# Patient Record
Sex: Male | Born: 1985 | Race: Black or African American | Hispanic: No | Marital: Single | Smoking: Current every day smoker
Health system: Southern US, Community
[De-identification: ages and names within clinical notes are randomized; demographics above are authoritative.]

---

## 2009-01-02 ENCOUNTER — Emergency Department (HOSPITAL_COMMUNITY): Admission: EM | Admit: 2009-01-02 | Discharge: 2009-01-02 | Payer: Self-pay | Admitting: Emergency Medicine

## 2011-01-08 ENCOUNTER — Emergency Department (HOSPITAL_COMMUNITY): Payer: Self-pay

## 2011-01-08 ENCOUNTER — Emergency Department (HOSPITAL_COMMUNITY)
Admission: EM | Admit: 2011-01-08 | Discharge: 2011-01-08 | Disposition: A | Discharge status: Home / Self Care | Payer: No Typology Code available for payment source | Attending: Emergency Medicine | Admitting: Emergency Medicine

## 2011-01-08 DIAGNOSIS — Y92410 Unspecified street and highway as the place of occurrence of the external cause: Secondary | ICD-10-CM | POA: Insufficient documentation

## 2011-01-08 DIAGNOSIS — M549 Dorsalgia, unspecified: Secondary | ICD-10-CM | POA: Insufficient documentation

## 2011-01-08 DIAGNOSIS — S335XXA Sprain of ligaments of lumbar spine, initial encounter: Secondary | ICD-10-CM | POA: Insufficient documentation

## 2013-02-09 ENCOUNTER — Encounter (HOSPITAL_COMMUNITY): Payer: Self-pay | Admitting: Emergency Medicine

## 2013-02-09 ENCOUNTER — Emergency Department (HOSPITAL_COMMUNITY)
Admission: EM | Admit: 2013-02-09 | Discharge: 2013-02-09 | Disposition: A | Discharge status: Home / Self Care | Payer: No Typology Code available for payment source | Attending: Emergency Medicine | Admitting: Emergency Medicine

## 2013-02-09 DIAGNOSIS — H109 Unspecified conjunctivitis: Secondary | ICD-10-CM | POA: Insufficient documentation

## 2013-02-09 DIAGNOSIS — F17200 Nicotine dependence, unspecified, uncomplicated: Secondary | ICD-10-CM | POA: Insufficient documentation

## 2013-02-09 MED ORDER — FLUORESCEIN SODIUM 1 MG OP STRP
1.0000 | ORAL_STRIP | Freq: Once | OPHTHALMIC | Status: AC
  Administered 2013-02-09: 1 via OPHTHALMIC
  Filled 2013-02-09: qty 1

## 2013-02-09 MED ORDER — POLYMYXIN B-TRIMETHOPRIM 10000-0.1 UNIT/ML-% OP SOLN: 1.0000 [drp] | OPHTHALMIC | Status: DC

## 2013-02-09 MED ORDER — TETRACAINE HCL 0.5 % OP SOLN
1.0000 [drp] | Freq: Once | OPHTHALMIC | Status: AC
  Administered 2013-02-09: 1 [drp] via OPHTHALMIC
  Filled 2013-02-09: qty 2

## 2013-02-09 NOTE — ED Notes (Signed)
Pt alert, nad pt c/o drainage to left eye, onset was a few days ago, tried home remedies w/o relief, resp even unlabored, skin pwd

## 2013-02-09 NOTE — Progress Notes (Signed)
[facility] provided pt with a list of primary care resources and information on [facility].  °

## 2013-02-09 NOTE — ED Provider Notes (Signed)
Medical screening examination/treatment/procedure(s) were performed by non-physician practitioner and as supervising physician I was immediately available for consultation/collaboration.  Terez Montee L Ruhani Umland, MD 02/09/13 1450 

## 2013-02-09 NOTE — ED Provider Notes (Signed)
CSN: [id number]     Arrival date & time 02/09/13  1036 History   First MD Initiated Contact with Patient 02/09/13 1052     Chief Complaint  Patient presents with  . Conjunctivitis   (Consider location/radiation/quality/duration/timing/severity/associated sxs/prior Treatment) HPI Comments: Patient is a 27 year old male who presents with a 4 day history of right eye irritation. Symptoms started gradually and progressively worsened since the onset. Patient reports associated redness and watery discharge from right eye. No known sick contacts. Patient denies injury or foreign body. No aggravating/alleviating factors. No other associated symptoms.    History reviewed. No pertinent past medical history. History reviewed. No pertinent past surgical history. No family history on file. History  Substance Use Topics  . Smoking status: Current Every Day Smoker    Types: Cigarettes  . Smokeless tobacco: Not on file  . Alcohol Use: No    Review of Systems  Constitutional: Negative for fever, chills and fatigue.  HENT: Negative for trouble swallowing.   Eyes: Positive for discharge and redness. Negative for visual disturbance.  Respiratory: Negative for shortness of breath.   Cardiovascular: Negative for chest pain and palpitations.  Gastrointestinal: Negative for nausea, vomiting, abdominal pain and diarrhea.  Genitourinary: Negative for dysuria and difficulty urinating.  Musculoskeletal: Negative for arthralgias and neck pain.  Skin: Negative for color change.  Neurological: Negative for dizziness and weakness.  Psychiatric/Behavioral: Negative for dysphoric mood.    Allergies  Review of patient's allergies indicates no known allergies.  Home Medications   Current Outpatient Rx  Name  Route  Sig  Dispense  Refill  . naphazoline-glycerin (CLEAR EYES) 0.012-0.2 % SOLN   Both Eyes   Place 1-2 drops into both eyes every 4 (four) hours as needed for irritation.         Marland Kitchen OVER THE  COUNTER MEDICATION      [employer] brand eye ointment          BP 130/68  Pulse 79  Temp(Src) 98 F (36.7 C) (Oral)  Resp 16  Wt 235 lb (106.595 kg)  SpO2 99% Physical Exam  Nursing note and vitals reviewed. Constitutional: He is oriented to person, place, and time. He appears well-developed and well-nourished. No distress.  HENT:  Head: Normocephalic and atraumatic.  Eyes: EOM are normal. Pupils are equal, round, and reactive to light.  Right conjunctival injection. Water discharge noted. No purulent discharge.   Neck: Normal range of motion.  Cardiovascular: Normal rate and regular rhythm.  Exam reveals no gallop and no friction rub.   No murmur heard. Pulmonary/Chest: Effort normal and breath sounds normal. He has no wheezes. He has no rales. He exhibits no tenderness.  Musculoskeletal: Normal range of motion.  Neurological: He is alert and oriented to person, place, and time. Coordination normal.  Speech is goal-oriented. Moves limbs without ataxia.   Skin: Skin is warm and dry.  Psychiatric: He has a normal mood and affect. His behavior is normal.    ED Course  Procedures (including critical care time) Labs Review Labs Reviewed - No data to display Imaging Review No results found.  EKG Interpretation   None       MDM   1. Conjunctivitis     11:34 AM Patient will have a fluorescin dye exam of right eye. Vitals stable and patient afebrile.   12:06 PM Fluorescin dye exam shows no abrasion or foreign body. Patient will be discharged with polytrim eyedrops. Vitals stable and patient afebrile. Patient will be  discharged without further evaluation.   Emilia Beck, PA-C 02/09/13 1209

## 2014-04-02 ENCOUNTER — Emergency Department (INDEPENDENT_AMBULATORY_CARE_PROVIDER_SITE_OTHER)
Admission: EM | Admit: 2014-04-02 | Discharge: 2014-04-02 | Disposition: A | Discharge status: Home / Self Care | Payer: Self-pay | Source: Home / Self Care | Attending: Family Medicine | Admitting: Family Medicine

## 2014-04-02 ENCOUNTER — Encounter (HOSPITAL_COMMUNITY): Payer: Self-pay | Admitting: Emergency Medicine

## 2014-04-02 ENCOUNTER — Emergency Department (HOSPITAL_COMMUNITY)
Admission: EM | Admit: 2014-04-02 | Discharge: 2014-04-02 | Discharge status: Against Medical Advice | Payer: No Typology Code available for payment source

## 2014-04-02 DIAGNOSIS — S0081XA Abrasion of other part of head, initial encounter: Secondary | ICD-10-CM

## 2014-04-02 NOTE — ED Notes (Signed)
Pt left according to staff in registration.

## 2014-04-02 NOTE — ED Notes (Signed)
Reports slipping on ice and hitting right eye on car door.  Mild swelling and redness.    Abrasion at right brow.  Denies any visual changes.

## 2014-04-02 NOTE — Discharge Instructions (Signed)
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
## 2019-07-20 ENCOUNTER — Inpatient Hospital Stay (HOSPITAL_COMMUNITY): Payer: Self-pay

## 2019-07-20 ENCOUNTER — Emergency Department (HOSPITAL_COMMUNITY): Payer: Self-pay

## 2019-07-20 ENCOUNTER — Inpatient Hospital Stay (HOSPITAL_COMMUNITY): Payer: Self-pay | Admitting: Certified Registered"

## 2019-07-20 ENCOUNTER — Encounter (HOSPITAL_COMMUNITY): Payer: Self-pay | Admitting: Emergency Medicine

## 2019-07-20 ENCOUNTER — Encounter (HOSPITAL_COMMUNITY)
Admission: EM | Disposition: A | Discharge status: Home / Self Care | Payer: Self-pay | Source: Home / Self Care | Attending: Orthopedic Surgery

## 2019-07-20 ENCOUNTER — Inpatient Hospital Stay (HOSPITAL_COMMUNITY)
Admission: EM | Admit: 2019-07-20 | Discharge: 2019-07-21 | DRG: 494 | Disposition: A | Discharge status: Home / Self Care | Payer: Self-pay | Attending: Orthopedic Surgery | Admitting: Orthopedic Surgery

## 2019-07-20 DIAGNOSIS — S82251B Displaced comminuted fracture of shaft of right tibia, initial encounter for open fracture type I or II: Principal | ICD-10-CM | POA: Diagnosis present

## 2019-07-20 DIAGNOSIS — S82201A Unspecified fracture of shaft of right tibia, initial encounter for closed fracture: Secondary | ICD-10-CM | POA: Diagnosis present

## 2019-07-20 DIAGNOSIS — M62838 Other muscle spasm: Secondary | ICD-10-CM | POA: Diagnosis present

## 2019-07-20 DIAGNOSIS — S82202A Unspecified fracture of shaft of left tibia, initial encounter for closed fracture: Secondary | ICD-10-CM

## 2019-07-20 DIAGNOSIS — Z20822 Contact with and (suspected) exposure to covid-19: Secondary | ICD-10-CM | POA: Diagnosis present

## 2019-07-20 DIAGNOSIS — F17210 Nicotine dependence, cigarettes, uncomplicated: Secondary | ICD-10-CM | POA: Diagnosis present

## 2019-07-20 DIAGNOSIS — Z419 Encounter for procedure for purposes other than remedying health state, unspecified: Secondary | ICD-10-CM

## 2019-07-20 DIAGNOSIS — W3400XA Accidental discharge from unspecified firearms or gun, initial encounter: Secondary | ICD-10-CM

## 2019-07-20 DIAGNOSIS — S82101A Unspecified fracture of upper end of right tibia, initial encounter for closed fracture: Secondary | ICD-10-CM | POA: Diagnosis present

## 2019-07-20 HISTORY — PX: TIBIA IM NAIL INSERTION: SHX2516

## 2019-07-20 LAB — MRSA PCR SCREENING: MRSA by PCR: NEGATIVE

## 2019-07-20 LAB — CBC WITH DIFFERENTIAL/PLATELET
Abs Immature Granulocytes: 0.12 10*3/uL — ABNORMAL HIGH (ref 0.00–0.07)
Basophils Absolute: 0.1 10*3/uL (ref 0.0–0.1)
Basophils Relative: 0 %
Eosinophils Absolute: 0 10*3/uL (ref 0.0–0.5)
Eosinophils Relative: 0 %
HCT: 40.6 % (ref 39.0–52.0)
Hemoglobin: 13.3 g/dL (ref 13.0–17.0)
Immature Granulocytes: 1 %
Lymphocytes Relative: 7 %
Lymphs Abs: 1.6 10*3/uL (ref 0.7–4.0)
MCH: 32 pg (ref 26.0–34.0)
MCHC: 32.8 g/dL (ref 30.0–36.0)
MCV: 97.6 fL (ref 80.0–100.0)
Monocytes Absolute: 1 10*3/uL (ref 0.1–1.0)
Monocytes Relative: 4 %
Neutro Abs: 20 10*3/uL — ABNORMAL HIGH (ref 1.7–7.7)
Neutrophils Relative %: 88 %
Platelets: 284 10*3/uL (ref 150–400)
RBC: 4.16 MIL/uL — ABNORMAL LOW (ref 4.22–5.81)
RDW: 13.6 % (ref 11.5–15.5)
WBC: 22.8 10*3/uL — ABNORMAL HIGH (ref 4.0–10.5)
nRBC: 0 % (ref 0.0–0.2)

## 2019-07-20 LAB — SARS CORONAVIRUS 2 BY RT PCR (HOSPITAL ORDER, PERFORMED IN ~~LOC~~ HOSPITAL LAB): SARS Coronavirus 2: NEGATIVE

## 2019-07-20 LAB — COMPREHENSIVE METABOLIC PANEL
ALT: 17 U/L (ref 0–44)
AST: 28 U/L (ref 15–41)
Albumin: 3.8 g/dL (ref 3.5–5.0)
Alkaline Phosphatase: 54 U/L (ref 38–126)
Anion gap: 9 (ref 5–15)
BUN: 13 mg/dL (ref 6–20)
CO2: 26 mmol/L (ref 22–32)
Calcium: 9 mg/dL (ref 8.9–10.3)
Chloride: 102 mmol/L (ref 98–111)
Creatinine, Ser: 1.08 mg/dL (ref 0.61–1.24)
GFR calc Af Amer: >60 mL/min (ref >60)
GFR calc non Af Amer: >60 mL/min (ref >60)
Glucose, Bld: 126 mg/dL — ABNORMAL HIGH (ref 70–99)
Potassium: 3.8 mmol/L (ref 3.5–5.1)
Sodium: 137 mmol/L (ref 135–145)
Total Bilirubin: 0.6 mg/dL (ref 0.3–1.2)
Total Protein: 6.1 g/dL — ABNORMAL LOW (ref 6.5–8.1)

## 2019-07-20 LAB — ABO/RH: ABO/RH(D): O POS

## 2019-07-20 LAB — PROTIME-INR
INR: 1 (ref 0.8–1.2)
Prothrombin Time: 12.6 seconds (ref 11.4–15.2)

## 2019-07-20 LAB — HIV ANTIBODY (ROUTINE TESTING W REFLEX): HIV Screen 4th Generation wRfx: NONREACTIVE

## 2019-07-20 LAB — TYPE AND SCREEN
ABO/RH(D): O POS
Antibody Screen: NEGATIVE

## 2019-07-20 SURGERY — INSERTION, INTRAMEDULLARY ROD, TIBIA
Anesthesia: General | Site: Leg Lower | Laterality: Right

## 2019-07-20 MED ORDER — HYDROMORPHONE HCL 1 MG/ML IJ SOLN
1.0000 mg | INTRAMUSCULAR | Status: AC | PRN
  Administered 2019-07-20 (×3): 1 mg via INTRAVENOUS
  Filled 2019-07-20 (×4): qty 1

## 2019-07-20 MED ORDER — ONDANSETRON HCL 4 MG/2ML IJ SOLN: 4.0000 mg | Freq: Four times a day (QID) | INTRAMUSCULAR | Status: DC | PRN

## 2019-07-20 MED ORDER — ACETAMINOPHEN 500 MG PO TABS
1000.0000 mg | ORAL_TABLET | Freq: Four times a day (QID) | ORAL | Status: DC
  Administered 2019-07-20 – 2019-07-21 (×3): 1000 mg via ORAL
  Filled 2019-07-20 (×3): qty 2

## 2019-07-20 MED ORDER — MIDAZOLAM HCL 2 MG/2ML IJ SOLN
INTRAMUSCULAR | Status: AC
  Filled 2019-07-20: qty 2

## 2019-07-20 MED ORDER — SENNA 8.6 MG PO TABS
1.0000 | ORAL_TABLET | Freq: Two times a day (BID) | ORAL | Status: DC
  Administered 2019-07-20: 8.6 mg via ORAL
  Filled 2019-07-20: qty 1

## 2019-07-20 MED ORDER — ONDANSETRON HCL 4 MG PO TABS: 4.0000 mg | ORAL_TABLET | Freq: Four times a day (QID) | ORAL | Status: DC | PRN

## 2019-07-20 MED ORDER — PROPOFOL 10 MG/ML IV BOLUS
INTRAVENOUS | Status: DC | PRN
  Administered 2019-07-20: 200 mg via INTRAVENOUS

## 2019-07-20 MED ORDER — DOCUSATE SODIUM 100 MG PO CAPS
100.0000 mg | ORAL_CAPSULE | Freq: Two times a day (BID) | ORAL | Status: DC
  Administered 2019-07-20: 100 mg via ORAL
  Filled 2019-07-20: qty 1

## 2019-07-20 MED ORDER — POTASSIUM CHLORIDE IN NACL 20-0.9 MEQ/L-% IV SOLN
INTRAVENOUS | Status: DC
  Filled 2019-07-20 (×2): qty 1000

## 2019-07-20 MED ORDER — LIDOCAINE 2% (20 MG/ML) 5 ML SYRINGE
INTRAMUSCULAR | Status: DC | PRN
  Administered 2019-07-20: 40 mg via INTRAVENOUS

## 2019-07-20 MED ORDER — 0.9 % SODIUM CHLORIDE (POUR BTL) OPTIME
TOPICAL | Status: DC | PRN
  Administered 2019-07-20: 1000 mL

## 2019-07-20 MED ORDER — METOCLOPRAMIDE HCL 5 MG PO TABS: 5.0000 mg | ORAL_TABLET | Freq: Three times a day (TID) | ORAL | Status: DC | PRN

## 2019-07-20 MED ORDER — OXYCODONE HCL 5 MG PO TABS
10.0000 mg | ORAL_TABLET | ORAL | Status: DC | PRN
  Administered 2019-07-21: 10 mg via ORAL

## 2019-07-20 MED ORDER — FENTANYL CITRATE (PF) 250 MCG/5ML IJ SOLN
INTRAMUSCULAR | Status: AC
  Filled 2019-07-20: qty 5

## 2019-07-20 MED ORDER — KETOROLAC TROMETHAMINE 15 MG/ML IJ SOLN
7.5000 mg | Freq: Four times a day (QID) | INTRAMUSCULAR | Status: DC
  Administered 2019-07-20 – 2019-07-21 (×3): 7.5 mg via INTRAVENOUS
  Filled 2019-07-20 (×3): qty 1

## 2019-07-20 MED ORDER — ZOLPIDEM TARTRATE 5 MG PO TABS: 5.0000 mg | ORAL_TABLET | Freq: Every evening | ORAL | Status: DC | PRN

## 2019-07-20 MED ORDER — OXYCODONE HCL 5 MG PO TABS
5.0000 mg | ORAL_TABLET | ORAL | Status: DC | PRN
  Administered 2019-07-20 – 2019-07-21 (×2): 10 mg via ORAL
  Filled 2019-07-20 (×3): qty 2

## 2019-07-20 MED ORDER — METHOCARBAMOL 500 MG PO TABS: 500.0000 mg | ORAL_TABLET | Freq: Four times a day (QID) | ORAL | Status: DC | PRN

## 2019-07-20 MED ORDER — CEFAZOLIN SODIUM-DEXTROSE 2-4 GM/100ML-% IV SOLN
2.0000 g | INTRAVENOUS | Status: AC
  Administered 2019-07-20: 2 g via INTRAVENOUS
  Filled 2019-07-20: qty 100

## 2019-07-20 MED ORDER — ACETAMINOPHEN 10 MG/ML IV SOLN
INTRAVENOUS | Status: AC
  Administered 2019-07-20: 1000 mg via INTRAVENOUS
  Filled 2019-07-20: qty 100

## 2019-07-20 MED ORDER — CEFAZOLIN SODIUM-DEXTROSE 2-4 GM/100ML-% IV SOLN
2.0000 g | Freq: Four times a day (QID) | INTRAVENOUS | Status: AC
  Administered 2019-07-20 – 2019-07-21 (×3): 2 g via INTRAVENOUS
  Filled 2019-07-20 (×3): qty 100

## 2019-07-20 MED ORDER — METHOCARBAMOL 1000 MG/10ML IJ SOLN: 500.0000 mg | Freq: Four times a day (QID) | INTRAVENOUS | Status: DC | PRN

## 2019-07-20 MED ORDER — DEXMEDETOMIDINE HCL IN NACL 200 MCG/50ML IV SOLN
INTRAVENOUS | Status: AC
  Filled 2019-07-20: qty 50

## 2019-07-20 MED ORDER — DIPHENHYDRAMINE HCL 50 MG/ML IJ SOLN
INTRAMUSCULAR | Status: AC
  Filled 2019-07-20: qty 1

## 2019-07-20 MED ORDER — CHLORHEXIDINE GLUCONATE 4 % EX LIQD
60.0000 mL | Freq: Once | CUTANEOUS | Status: DC
  Filled 2019-07-20: qty 60

## 2019-07-20 MED ORDER — CEFAZOLIN SODIUM-DEXTROSE 2-4 GM/100ML-% IV SOLN
INTRAVENOUS | Status: AC
  Filled 2019-07-20: qty 100

## 2019-07-20 MED ORDER — MIDAZOLAM HCL 5 MG/5ML IJ SOLN
INTRAMUSCULAR | Status: DC | PRN
  Administered 2019-07-20: 2 mg via INTRAVENOUS

## 2019-07-20 MED ORDER — CEFAZOLIN SODIUM-DEXTROSE 1-4 GM/50ML-% IV SOLN
1.0000 g | Freq: Once | INTRAVENOUS | Status: AC
  Administered 2019-07-20: 1 g via INTRAVENOUS
  Filled 2019-07-20: qty 50

## 2019-07-20 MED ORDER — DIPHENHYDRAMINE HCL 12.5 MG/5ML PO ELIX: 12.5000 mg | ORAL_SOLUTION | ORAL | Status: DC | PRN

## 2019-07-20 MED ORDER — SUGAMMADEX SODIUM 200 MG/2ML IV SOLN
INTRAVENOUS | Status: DC | PRN
  Administered 2019-07-20: 200 mg via INTRAVENOUS

## 2019-07-20 MED ORDER — OXYCODONE HCL 5 MG PO TABS: 5.0000 mg | ORAL_TABLET | ORAL | Status: DC | PRN

## 2019-07-20 MED ORDER — METHOCARBAMOL 1000 MG/10ML IJ SOLN
500.0000 mg | Freq: Four times a day (QID) | INTRAVENOUS | Status: DC | PRN
  Filled 2019-07-20: qty 5

## 2019-07-20 MED ORDER — BISACODYL 10 MG RE SUPP: 10.0000 mg | Freq: Every day | RECTAL | Status: DC | PRN

## 2019-07-20 MED ORDER — ALBUMIN HUMAN 5 % IV SOLN
INTRAVENOUS | Status: DC | PRN
Start: 2019-07-20 — End: 2019-07-20

## 2019-07-20 MED ORDER — SENNOSIDES-DOCUSATE SODIUM 8.6-50 MG PO TABS: 1.0000 | ORAL_TABLET | Freq: Every evening | ORAL | Status: DC | PRN

## 2019-07-20 MED ORDER — FENTANYL CITRATE (PF) 100 MCG/2ML IJ SOLN
INTRAMUSCULAR | Status: DC | PRN
  Administered 2019-07-20: 100 ug via INTRAVENOUS
  Administered 2019-07-20 (×3): 50 ug via INTRAVENOUS
  Administered 2019-07-20: 100 ug via INTRAVENOUS
  Administered 2019-07-20 (×3): 50 ug via INTRAVENOUS

## 2019-07-20 MED ORDER — HYDROMORPHONE HCL 1 MG/ML IJ SOLN: 0.2500 mg | INTRAMUSCULAR | Status: DC | PRN

## 2019-07-20 MED ORDER — MAGNESIUM CITRATE PO SOLN: 1.0000 | Freq: Once | ORAL | Status: DC | PRN

## 2019-07-20 MED ORDER — METOCLOPRAMIDE HCL 5 MG/ML IJ SOLN: 5.0000 mg | Freq: Three times a day (TID) | INTRAMUSCULAR | Status: DC | PRN

## 2019-07-20 MED ORDER — BACLOFEN 10 MG PO TABS
10.0000 mg | ORAL_TABLET | Freq: Three times a day (TID) | ORAL | 0 refills | Status: AC
Start: 2019-07-20 — End: ?

## 2019-07-20 MED ORDER — POTASSIUM CHLORIDE IN NACL 20-0.45 MEQ/L-% IV SOLN
INTRAVENOUS | Status: DC
  Filled 2019-07-20 (×2): qty 1000

## 2019-07-20 MED ORDER — LACTATED RINGERS IV SOLN: INTRAVENOUS | Status: DC

## 2019-07-20 MED ORDER — DEXMEDETOMIDINE HCL IN NACL 200 MCG/50ML IV SOLN
INTRAVENOUS | Status: DC | PRN
Start: 2019-07-20 — End: 2019-07-20
  Administered 2019-07-20: 12 ug via INTRAVENOUS
  Administered 2019-07-20 (×2): 8 ug via INTRAVENOUS

## 2019-07-20 MED ORDER — HYDROMORPHONE HCL 1 MG/ML IJ SOLN
0.5000 mg | INTRAMUSCULAR | Status: DC | PRN
  Administered 2019-07-20: 1 mg via INTRAVENOUS
  Filled 2019-07-20: qty 1

## 2019-07-20 MED ORDER — PROPOFOL 10 MG/ML IV BOLUS
INTRAVENOUS | Status: AC
  Filled 2019-07-20: qty 40

## 2019-07-20 MED ORDER — DIPHENHYDRAMINE HCL 50 MG/ML IJ SOLN
INTRAMUSCULAR | Status: DC | PRN
Start: 2019-07-20 — End: 2019-07-20
  Administered 2019-07-20: 12.5 mg via INTRAVENOUS

## 2019-07-20 MED ORDER — OXYCODONE HCL 5 MG/5ML PO SOLN: 5.0000 mg | Freq: Once | ORAL | Status: DC | PRN

## 2019-07-20 MED ORDER — FENTANYL CITRATE (PF) 100 MCG/2ML IJ SOLN
25.0000 ug | INTRAMUSCULAR | Status: DC | PRN
  Administered 2019-07-20: 50 ug via INTRAVENOUS

## 2019-07-20 MED ORDER — HYDROMORPHONE HCL 1 MG/ML IJ SOLN
INTRAMUSCULAR | Status: AC
  Filled 2019-07-20: qty 1

## 2019-07-20 MED ORDER — ARTIFICIAL TEARS OPHTHALMIC OINT
TOPICAL_OINTMENT | OPHTHALMIC | Status: AC
  Filled 2019-07-20: qty 3.5

## 2019-07-20 MED ORDER — ONDANSETRON HCL 4 MG/2ML IJ SOLN
INTRAMUSCULAR | Status: DC | PRN
  Administered 2019-07-20: 4 mg via INTRAVENOUS

## 2019-07-20 MED ORDER — OXYCODONE HCL 5 MG PO TABS: 5.0000 mg | ORAL_TABLET | ORAL | 0 refills | Status: AC | PRN

## 2019-07-20 MED ORDER — HYDROMORPHONE HCL 1 MG/ML IJ SOLN
0.5000 mg | INTRAMUSCULAR | Status: DC | PRN
  Administered 2019-07-20 – 2019-07-21 (×2): 1 mg via INTRAVENOUS
  Filled 2019-07-20 (×2): qty 1

## 2019-07-20 MED ORDER — OXYCODONE HCL 5 MG PO TABS
10.0000 mg | ORAL_TABLET | ORAL | Status: DC | PRN
  Administered 2019-07-20: 10 mg via ORAL
  Filled 2019-07-20: qty 2

## 2019-07-20 MED ORDER — SENNA-DOCUSATE SODIUM 8.6-50 MG PO TABS: 2.0000 | ORAL_TABLET | Freq: Every day | ORAL | 1 refills | Status: AC

## 2019-07-20 MED ORDER — ASPIRIN EC 325 MG PO TBEC: 325.0000 mg | DELAYED_RELEASE_TABLET | Freq: Two times a day (BID) | ORAL | 0 refills | Status: AC

## 2019-07-20 MED ORDER — OXYCODONE HCL 5 MG PO TABS: 5.0000 mg | ORAL_TABLET | Freq: Once | ORAL | Status: DC | PRN

## 2019-07-20 MED ORDER — ACETAMINOPHEN 325 MG PO TABS: 325.0000 mg | ORAL_TABLET | Freq: Four times a day (QID) | ORAL | Status: DC | PRN

## 2019-07-20 MED ORDER — FENTANYL CITRATE (PF) 100 MCG/2ML IJ SOLN
INTRAMUSCULAR | Status: AC
  Administered 2019-07-20: 50 ug via INTRAVENOUS
  Filled 2019-07-20: qty 2

## 2019-07-20 MED ORDER — ASPIRIN 325 MG PO TABS
325.0000 mg | ORAL_TABLET | Freq: Two times a day (BID) | ORAL | Status: DC
  Administered 2019-07-20 – 2019-07-21 (×2): 325 mg via ORAL
  Filled 2019-07-20 (×2): qty 1

## 2019-07-20 MED ORDER — SUCCINYLCHOLINE CHLORIDE 200 MG/10ML IV SOSY
PREFILLED_SYRINGE | INTRAVENOUS | Status: AC
  Filled 2019-07-20: qty 30

## 2019-07-20 MED ORDER — ONDANSETRON HCL 4 MG PO TABS: 4.0000 mg | ORAL_TABLET | Freq: Three times a day (TID) | ORAL | 0 refills | Status: AC | PRN

## 2019-07-20 MED ORDER — ACETAMINOPHEN 10 MG/ML IV SOLN: 1000.0000 mg | Freq: Once | INTRAVENOUS | Status: DC | PRN

## 2019-07-20 MED ORDER — LIDOCAINE 2% (20 MG/ML) 5 ML SYRINGE
INTRAMUSCULAR | Status: AC
  Filled 2019-07-20: qty 5

## 2019-07-20 MED ORDER — ACETAMINOPHEN 500 MG PO TABS
1000.0000 mg | ORAL_TABLET | Freq: Four times a day (QID) | ORAL | Status: DC
  Administered 2019-07-20: 1000 mg via ORAL
  Filled 2019-07-20 (×2): qty 2

## 2019-07-20 MED ORDER — ROCURONIUM BROMIDE 10 MG/ML (PF) SYRINGE
PREFILLED_SYRINGE | INTRAVENOUS | Status: AC
  Filled 2019-07-20: qty 30

## 2019-07-20 MED ORDER — LACTATED RINGERS IV SOLN: INTRAVENOUS | Status: DC | PRN

## 2019-07-20 MED ORDER — ROCURONIUM BROMIDE 10 MG/ML (PF) SYRINGE
PREFILLED_SYRINGE | INTRAVENOUS | Status: DC | PRN
  Administered 2019-07-20: 70 mg via INTRAVENOUS
  Administered 2019-07-20: 30 mg via INTRAVENOUS

## 2019-07-20 MED ORDER — POVIDONE-IODINE 10 % EX SWAB: 2.0000 "application " | Freq: Once | CUTANEOUS | Status: DC

## 2019-07-20 MED ORDER — PROMETHAZINE HCL 25 MG/ML IJ SOLN
6.2500 mg | INTRAMUSCULAR | Status: DC | PRN
Start: 2019-07-20 — End: 2019-07-20

## 2019-07-20 MED ORDER — POLYETHYLENE GLYCOL 3350 17 G PO PACK: 17.0000 g | PACK | Freq: Every day | ORAL | Status: DC | PRN

## 2019-07-20 MED ORDER — ONDANSETRON HCL 4 MG/2ML IJ SOLN
INTRAMUSCULAR | Status: AC
  Filled 2019-07-20: qty 2

## 2019-07-20 MED ORDER — METHOCARBAMOL 500 MG PO TABS
500.0000 mg | ORAL_TABLET | Freq: Four times a day (QID) | ORAL | Status: DC | PRN
  Administered 2019-07-20 – 2019-07-21 (×3): 500 mg via ORAL
  Filled 2019-07-20 (×3): qty 1

## 2019-07-20 MED ORDER — ENSURE PRE-SURGERY PO LIQD
296.0000 mL | Freq: Once | ORAL | Status: DC
  Filled 2019-07-20: qty 296

## 2019-07-20 MED ORDER — FLEET ENEMA 7-19 GM/118ML RE ENEM: 1.0000 | ENEMA | Freq: Once | RECTAL | Status: DC | PRN

## 2019-07-20 SURGICAL SUPPLY — 73 items
BANDAGE ESMARK 6X9 LF (GAUZE/BANDAGES/DRESSINGS) ×1 IMPLANT
BIT DRILL 2.5X2.75 QC CALB (BIT) ×3 IMPLANT
BIT DRILL 3.8X6 NS (BIT) ×3 IMPLANT
BIT DRILL 4.4 NS (BIT) ×3 IMPLANT
BLADE SURG 15 STRL LF DISP TIS (BLADE) ×1 IMPLANT
BLADE SURG 15 STRL SS (BLADE) ×2
BNDG COHESIVE 6X5 TAN STRL LF (GAUZE/BANDAGES/DRESSINGS) ×3 IMPLANT
BNDG ELASTIC 4X5.8 VLCR STR LF (GAUZE/BANDAGES/DRESSINGS) ×3 IMPLANT
BNDG ELASTIC 6X10 VLCR STRL LF (GAUZE/BANDAGES/DRESSINGS) ×6 IMPLANT
BNDG ELASTIC 6X5.8 VLCR STR LF (GAUZE/BANDAGES/DRESSINGS) ×3 IMPLANT
BNDG ESMARK 6X9 LF (GAUZE/BANDAGES/DRESSINGS) ×3
BNDG GAUZE ELAST 4 BULKY (GAUZE/BANDAGES/DRESSINGS) ×3 IMPLANT
BOOTCOVER CLEANROOM LRG (PROTECTIVE WEAR) ×6 IMPLANT
COVER SURGICAL LIGHT HANDLE (MISCELLANEOUS) ×6 IMPLANT
COVER WAND RF STERILE (DRAPES) ×3 IMPLANT
CUFF TOURN SGL QUICK 34 (TOURNIQUET CUFF)
CUFF TRNQT CYL 34X4.125X (TOURNIQUET CUFF) IMPLANT
DRAPE C-ARM 42X72 X-RAY (DRAPES) ×3 IMPLANT
DRAPE HALF SHEET 40X57 (DRAPES) ×6 IMPLANT
DRAPE IMP U-DRAPE 54X76 (DRAPES) ×3 IMPLANT
DRAPE ORTHO SPLIT 77X108 STRL (DRAPES) ×6
DRAPE SURG ORHT 6 SPLT 77X108 (DRAPES) ×3 IMPLANT
DRAPE U-SHAPE 47X51 STRL (DRAPES) ×3 IMPLANT
DRSG ADAPTIC 3X8 NADH LF (GAUZE/BANDAGES/DRESSINGS) ×3 IMPLANT
DRSG PAD ABDOMINAL 8X10 ST (GAUZE/BANDAGES/DRESSINGS) ×3 IMPLANT
DRSG XEROFORM 1X8 (GAUZE/BANDAGES/DRESSINGS) ×3 IMPLANT
DURAPREP 26ML APPLICATOR (WOUND CARE) ×3 IMPLANT
ELECT REM PT RETURN 9FT ADLT (ELECTROSURGICAL) ×3
ELECTRODE REM PT RTRN 9FT ADLT (ELECTROSURGICAL) ×1 IMPLANT
GAUZE SPONGE 4X4 12PLY STRL (GAUZE/BANDAGES/DRESSINGS) ×3 IMPLANT
GLOVE BIOGEL PI IND STRL 7.0 (GLOVE) ×1 IMPLANT
GLOVE BIOGEL PI INDICATOR 7.0 (GLOVE) ×2
GLOVE BIOGEL PI ORTHO PRO SZ8 (GLOVE) ×2
GLOVE ORTHO TXT STRL SZ7.5 (GLOVE) ×3 IMPLANT
GLOVE PI ORTHO PRO STRL SZ8 (GLOVE) ×1 IMPLANT
GLOVE SURG ORTHO 8.0 STRL STRW (GLOVE) ×6 IMPLANT
GOWN STRL REUS W/ TWL LRG LVL3 (GOWN DISPOSABLE) ×3 IMPLANT
GOWN STRL REUS W/TWL LRG LVL3 (GOWN DISPOSABLE) ×6
GUIDEPIN 3.2X17.5 THRD DISP (PIN) ×3 IMPLANT
GUIDEWIRE BALL NOSE 80CM (WIRE) ×3 IMPLANT
KIT BASIN OR (CUSTOM PROCEDURE TRAY) ×3 IMPLANT
KIT TURNOVER KIT B (KITS) ×3 IMPLANT
MANIFOLD NEPTUNE II (INSTRUMENTS) ×3 IMPLANT
NAIL TIBIAL 10X39M (Nail) ×3 IMPLANT
NS IRRIG 1000ML POUR BTL (IV SOLUTION) ×3 IMPLANT
PACK GENERAL/GYN (CUSTOM PROCEDURE TRAY) ×3 IMPLANT
PACK UNIVERSAL I (CUSTOM PROCEDURE TRAY) ×3 IMPLANT
PAD ARMBOARD 7.5X6 YLW CONV (MISCELLANEOUS) ×6 IMPLANT
PAD CAST 3X4 CTTN HI CHSV (CAST SUPPLIES) ×3 IMPLANT
PAD CAST 4YDX4 CTTN HI CHSV (CAST SUPPLIES) ×1 IMPLANT
PADDING CAST COTTON 3X4 STRL (CAST SUPPLIES) ×6
PADDING CAST COTTON 4X4 STRL (CAST SUPPLIES) ×2
PADDING CAST COTTON 6X4 STRL (CAST SUPPLIES) ×3 IMPLANT
PLATE TUB 100DEG 5 HO (Plate) ×3 IMPLANT
SCREW ACECAP 40MM (Screw) ×3 IMPLANT
SCREW ACECAP 46MM (Screw) ×3 IMPLANT
SCREW CORTICAL 3.5MM  10MM (Screw) ×8 IMPLANT
SCREW CORTICAL 3.5MM 10MM (Screw) ×4 IMPLANT
SCREW PROXIMAL DEPUY (Screw) ×2 IMPLANT
SCREW PROXIMAL75MMLX5.5MM (Screw) ×3 IMPLANT
SCREW PRXML FT 55X5.5XNS TIB (Screw) ×1 IMPLANT
SPLINT PLASTER CAST XFAST 5X30 (CAST SUPPLIES) ×1 IMPLANT
SPLINT PLASTER XFAST SET 5X30 (CAST SUPPLIES) ×2
STAPLER VISISTAT 35W (STAPLE) ×6 IMPLANT
STOCKINETTE IMPERVIOUS LG (DRAPES) ×3 IMPLANT
SUT VIC AB 0 CT1 18XCR BRD 8 (SUTURE) ×1 IMPLANT
SUT VIC AB 0 CT1 8-18 (SUTURE) ×2
SUT VIC AB 2-0 CT1 27 (SUTURE) ×4
SUT VIC AB 2-0 CT1 TAPERPNT 27 (SUTURE) ×2 IMPLANT
SUT VIC AB 3-0 SH 8-18 (SUTURE) ×3 IMPLANT
TOWEL GREEN STERILE (TOWEL DISPOSABLE) ×3 IMPLANT
TOWEL GREEN STERILE FF (TOWEL DISPOSABLE) ×3 IMPLANT
WATER STERILE IRR 1000ML POUR (IV SOLUTION) ×3 IMPLANT
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
## 2019-07-20 NOTE — Discharge Instructions (Signed)

## 2019-07-20 NOTE — Anesthesia Postprocedure Evaluation (Signed)
Anesthesia Post Note  Patient: Steve Adams  Procedure(s) Performed: INTRAMEDULLARY (IM) NAIL TIBIAL (Right Leg Lower)     Patient location during evaluation: PACU Anesthesia Type: General Level of consciousness: awake and alert Pain management: pain level controlled Vital Signs Assessment: post-procedure vital signs reviewed and stable Respiratory status: spontaneous breathing, nonlabored ventilation and respiratory function stable Cardiovascular status: blood pressure returned to baseline and stable Postop Assessment: no apparent nausea or vomiting Anesthetic complications: no    Last Vitals:  Vitals:   07/20/19 2040 07/20/19 2055  BP: 131/82 136/89  Pulse: 66 86  Resp: 16 15  Temp:  (!) 36.4 C  SpO2: 98% 100%                   Beryle Lathe
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
## 2019-07-20 NOTE — Anesthesia Procedure Notes (Signed)
Procedure Name: Intubation Date/Time: 07/20/2019 5:23 PM Performed by: Claudina Lick, CRNA Pre-anesthesia Checklist: Patient identified, Emergency Drugs available, Suction available, Patient being monitored and Timeout performed Patient Re-evaluated:Patient Re-evaluated prior to induction Oxygen Delivery Method: Circle system utilized Preoxygenation: Pre-oxygenation with 100% oxygen Induction Type: IV induction Ventilation: Mask ventilation without difficulty Laryngoscope Size: Miller and 2 Grade View: Grade I Tube type: Oral Tube size: 7.5 mm Number of attempts: 1 Airway Equipment and Method: Stylet Placement Confirmation: ETT inserted through vocal cords under direct vision,  positive ETCO2 and breath sounds checked- equal and bilateral Secured at: 23 cm Tube secured with: Tape Dental Injury: Teeth and Oropharynx as per pre-operative assessment

## 2019-07-20 NOTE — ED Notes (Signed)
Patient and family updated on status of OR  Will not go to later today

## 2019-07-20 NOTE — Anesthesia Preprocedure Evaluation (Signed)
Anesthesia Evaluation  Patient identified by MRN, date of birth, ID band Patient awake    Reviewed: Allergy & Precautions, H&P , NPO status , Patient's Chart, lab work & pertinent test results  Airway Mallampati: II   Neck ROM: full    Dental   Pulmonary Current Smoker,    breath sounds clear to auscultation       Cardiovascular negative cardio ROS   Rhythm:regular Rate:Normal     Neuro/Psych    GI/Hepatic   Endo/Other    Renal/GU      Musculoskeletal   Abdominal   Peds  Hematology   Anesthesia Other Findings   Reproductive/Obstetrics                             Anesthesia Physical Anesthesia Plan  ASA: II  Anesthesia Plan: General   Post-op Pain Management:    Induction: Intravenous  PONV Risk Score and Plan: 1 and Ondansetron, Dexamethasone, Midazolam and Treatment may vary due to age or medical condition  Airway Management Planned: Oral ETT  Additional Equipment:   Intra-op Plan:   Post-operative Plan: Extubation in OR  Informed Consent: I have reviewed the patients History and Physical, chart, labs and discussed the procedure including the risks, benefits and alternatives for the proposed anesthesia with the patient or authorized representative who has indicated his/her understanding and acceptance.       Plan Discussed with: CRNA, Anesthesiologist and Surgeon  Anesthesia Plan Comments:         Anesthesia Quick Evaluation  
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
## 2019-07-20 NOTE — ED Notes (Signed)
Ortho PA at bedside.  

## 2019-07-20 NOTE — Transfer of Care (Signed)
Immediate Anesthesia Transfer of Care Note  Patient: Steve Adams  Procedure(s) Performed: INTRAMEDULLARY (IM) NAIL TIBIAL (Right Leg Lower)  Patient Location: PACU  Anesthesia Type:General  Level of Consciousness: awake  Airway & Oxygen Therapy: Patient Spontanous Breathing and Patient connected to face mask oxygen  Post-op Assessment: Report given to RN and Post -op Vital signs reviewed and stable  Post vital signs: Reviewed and stable  Last Vitals:  Vitals Value Taken Time  BP 113/97 07/20/19 2008  Temp    Pulse 94 07/20/19 2008  Resp 18 07/20/19 2008  SpO2 100 % 07/20/19 2008  Vitals shown include unvalidated device data.  Last Pain:  Vitals:   07/20/19 1414  TempSrc: Oral  PainSc:          Complications: No apparent anesthesia complications

## 2019-07-20 NOTE — Progress Notes (Signed)
Orthopedic Tech Progress Note Patient Details:  Steve Adams 08/10/1985 [id number]  Ortho Devices Type of Ortho Device: Long leg splint Ortho Device/Splint Location: RLE Ortho Device/Splint Interventions: Application   Post Interventions Patient Tolerated: Well Instructions Provided: Care of device   Takya Vandivier E Ahron Hulbert 07/20/2019, 2:48 AM

## 2019-07-21 LAB — CBC
HCT: 35.3 % — ABNORMAL LOW (ref 39.0–52.0)
Hemoglobin: 11.8 g/dL — ABNORMAL LOW (ref 13.0–17.0)
MCH: 32.3 pg (ref 26.0–34.0)
MCHC: 33.4 g/dL (ref 30.0–36.0)
MCV: 96.7 fL (ref 80.0–100.0)
Platelets: 268 10*3/uL (ref 150–400)
RBC: 3.65 MIL/uL — ABNORMAL LOW (ref 4.22–5.81)
RDW: 13.3 % (ref 11.5–15.5)
WBC: 12.6 10*3/uL — ABNORMAL HIGH (ref 4.0–10.5)
nRBC: 0 % (ref 0.0–0.2)

## 2019-07-21 LAB — BASIC METABOLIC PANEL
Anion gap: 10 (ref 5–15)
BUN: 8 mg/dL (ref 6–20)
CO2: 27 mmol/L (ref 22–32)
Calcium: 8.6 mg/dL — ABNORMAL LOW (ref 8.9–10.3)
Chloride: 102 mmol/L (ref 98–111)
Creatinine, Ser: 1.03 mg/dL (ref 0.61–1.24)
GFR calc Af Amer: >60 mL/min (ref >60)
GFR calc non Af Amer: >60 mL/min (ref >60)
Glucose, Bld: 121 mg/dL — ABNORMAL HIGH (ref 70–99)
Potassium: 3.8 mmol/L (ref 3.5–5.1)
Sodium: 139 mmol/L (ref 135–145)

## 2019-07-21 NOTE — Plan of Care (Signed)

## 2019-07-21 NOTE — Plan of Care (Signed)
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
## 2019-07-21 NOTE — Progress Notes (Signed)
Provided discharge education/instructions, all questions and concerns addressed, Pt not in distress, crutches delivered to room, discharged home with belongings accompanied by friend.

## 2019-07-21 NOTE — Progress Notes (Signed)
[facility]TS [facility]AC[facility]  Lara Mulch, MD    Carlyon Shadow, PA-C [address], [place], [place]  [address]                             [phone]   PROGRESS NOTE  Subjective:  negative for Chest Pain  negative for Shortness of Breath  negative for Nausea/Vomiting   negative for Calf Pain  negative for Bowel Movement   Tolerating Diet: yes         Patient reports pain as 5 on 0-10 scale.    Objective: Vital signs in last 24 hours:    Patient Vitals for the past 24 hrs:  BP Temp Temp src Pulse Resp SpO2 Height Weight  07/21/19 0312 139/85 98.4 F (36.9 C) Oral 78 15 99 % -- --  07/21/19 0000 (!) 143/86 98.3 F (36.8 C) Oral 72 16 100 % -- --  07/20/19 2055 136/89 (!) 97.5 F (36.4 C) -- 86 15 100 % -- --  07/20/19 2040 131/82 -- -- 66 16 98 % -- --  07/20/19 2025 (!) 143/90 -- -- 73 14 98 % -- --  07/20/19 2010 -- 98.2 F (36.8 C) -- -- -- -- -- --  07/20/19 1501 -- -- -- -- -- -- 6\' 3"  (1.905 m) 102.1 kg  07/20/19 1414 139/84 98.4 F (36.9 C) Oral 69 -- 97 % -- --  07/20/19 1200 123/81 -- -- 71 16 97 % -- --  07/20/19 1100 127/83 -- -- 79 -- 98 % -- --  07/20/19 1030 130/87 -- -- 68 -- 97 % -- --  07/20/19 0900 135/82 -- -- 75 14 97 % -- --  07/20/19 0845 -- -- -- 78 19 98 % -- --  07/20/19 0800 (!) 132/93 -- -- 62 19 99 % -- --    @flow {1959:LAST@   Intake/Output from previous day:   05/14 0701 - 05/15 0700 In: 2350 [I.V.:1900] Out: 1400 [Urine:1200]   Intake/Output this shift:   No intake/output data recorded.   Intake/Output      05/14 0701 - 05/15 0700 05/15 0701 - 05/16 0700   I.V. (mL/kg) 1900 (18.6)    IV Piggyback 450    Total Intake(mL/kg) 2350 (23)    Urine (mL/kg/hr) 1200 (0.5)    Blood 200    Total Output 1400    Net +950            LABORATORY DATA: Recent Labs    07/20/19 0521 07/21/19 0415  WBC 22.8* 12.6*  HGB 13.3 11.8*  HCT 40.6 35.3*  PLT 284 268   Recent Labs    07/20/19 0521 07/21/19 0415  NA  137 139  K 3.8 3.8  CL 102 102  CO2 26 27  BUN 13 8  CREATININE 1.08 1.03  GLUCOSE 126* 121*  CALCIUM 9.0 8.6*   Lab Results  Component Value Date   INR 1.0 07/20/2019    Examination:  General appearance: alert, cooperative and no distress Extremities: extremities normal, atraumatic, no cyanosis or edema  Wound Exam: clean, dry, intact   Drainage:  None: wound tissue dry  Motor Exam: Quadriceps and Hamstrings Intact  Sensory Exam: Superficial Peroneal, Deep Peroneal and Tibial normal   Assessment:    1 Day Post-Op  Procedure(s) (LRB): INTRAMEDULLARY (IM) NAIL TIBIAL (Right)  ADDITIONAL DIAGNOSIS:  Active Problems:   Right tibial fracture     Plan: Physical Therapy  as ordered Touch Down Weight Bearing (TDWB)  DVT Prophylaxis:  Aspirin  DISCHARGE PLAN: Home  Patient doing well and resting in bed. Pain is under control. Toe touch wt bearing. Will see hoe PT goes. Pan on D/C home tomorrow  Guy Sandifer 07/21/2019, 7:39 AM

## 2019-07-21 NOTE — Progress Notes (Signed)
Orthopedic Tech Progress Note Patient Details:  Steve Adams [birth year] [id number]  Ortho Devices Type of Ortho Device: Crutches Ortho Device/Splint Location: RLE Ortho Device/Splint Interventions: Ordered   Post Interventions Patient Tolerated: Well Instructions Provided: Adjustment of device   Gwendolyn Lima 07/21/2019, 2:08 PM

## 2019-07-23 ENCOUNTER — Encounter: Payer: Self-pay | Admitting: *Deleted
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
## 2020-01-24 ENCOUNTER — Other Ambulatory Visit: Payer: Self-pay | Admitting: Orthopedic Surgery

## 2020-01-24 DIAGNOSIS — S82254K Nondisplaced comminuted fracture of shaft of right tibia, subsequent encounter for closed fracture with nonunion: Secondary | ICD-10-CM

## 2020-02-13 ENCOUNTER — Ambulatory Visit
Admission: RE | Admit: 2020-02-13 | Discharge: 2020-02-13 | Disposition: A | Discharge status: Home / Self Care | Payer: Self-pay | Source: Ambulatory Visit | Attending: Orthopedic Surgery | Admitting: Orthopedic Surgery

## 2020-02-13 DIAGNOSIS — S82254K Nondisplaced comminuted fracture of shaft of right tibia, subsequent encounter for closed fracture with nonunion: Secondary | ICD-10-CM

## 2020-06-12 ENCOUNTER — Other Ambulatory Visit (HOSPITAL_COMMUNITY): Payer: Self-pay | Admitting: Orthopedic Surgery

## 2020-06-12 ENCOUNTER — Other Ambulatory Visit: Payer: Self-pay | Admitting: Orthopedic Surgery

## 2020-06-12 DIAGNOSIS — S82201K Unspecified fracture of shaft of right tibia, subsequent encounter for closed fracture with nonunion: Secondary | ICD-10-CM

## 2020-06-12 DIAGNOSIS — S82401K Unspecified fracture of shaft of right fibula, subsequent encounter for closed fracture with nonunion: Secondary | ICD-10-CM

## 2020-06-16 ENCOUNTER — Other Ambulatory Visit: Payer: Self-pay

## 2020-06-16 ENCOUNTER — Ambulatory Visit (HOSPITAL_COMMUNITY)
Admission: RE | Admit: 2020-06-16 | Discharge: 2020-06-16 | Disposition: A | Discharge status: Home / Self Care | Payer: Self-pay | Source: Ambulatory Visit | Attending: Orthopedic Surgery | Admitting: Orthopedic Surgery

## 2020-06-16 DIAGNOSIS — S82201K Unspecified fracture of shaft of right tibia, subsequent encounter for closed fracture with nonunion: Secondary | ICD-10-CM

## 2020-06-16 DIAGNOSIS — S82401K Unspecified fracture of shaft of right fibula, subsequent encounter for closed fracture with nonunion: Secondary | ICD-10-CM | POA: Insufficient documentation
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
